# Patient Record
Sex: Male | Born: 2000 | Race: White | Hispanic: No | Marital: Single | Smoking: Never smoker
Health system: Southern US, Community
[De-identification: ages and names within clinical notes are randomized; demographics above are authoritative.]

## PROBLEM LIST (undated history)

## (undated) DIAGNOSIS — F419 Anxiety disorder, unspecified: Secondary | ICD-10-CM

## (undated) DIAGNOSIS — F411 Generalized anxiety disorder: Secondary | ICD-10-CM

## (undated) DIAGNOSIS — F329 Major depressive disorder, single episode, unspecified: Secondary | ICD-10-CM

## (undated) DIAGNOSIS — F913 Oppositional defiant disorder: Secondary | ICD-10-CM

## (undated) DIAGNOSIS — Z973 Presence of spectacles and contact lenses: Secondary | ICD-10-CM

## (undated) DIAGNOSIS — F32A Depression, unspecified: Secondary | ICD-10-CM

## (undated) DIAGNOSIS — F4010 Social phobia, unspecified: Secondary | ICD-10-CM

## (undated) HISTORY — PX: OTHER SURGICAL HISTORY: SHX169

## (undated) HISTORY — DX: Depression, unspecified: F32.A

## (undated) HISTORY — DX: Oppositional defiant disorder: F91.3

## (undated) HISTORY — DX: Major depressive disorder, single episode, unspecified: F32.9

## (undated) HISTORY — DX: Presence of spectacles and contact lenses: Z97.3

---
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
## 2022-09-15 DIAGNOSIS — Z0279 Encounter for issue of other medical certificate: Secondary | ICD-10-CM

## 2022-10-31 ENCOUNTER — Ambulatory Visit (INDEPENDENT_AMBULATORY_CARE_PROVIDER_SITE_OTHER): Payer: BC Managed Care – PPO | Admitting: Behavioral Health

## 2022-10-31 DIAGNOSIS — Z0389 Encounter for observation for other suspected diseases and conditions ruled out: Secondary | ICD-10-CM

## 2022-10-31 NOTE — Progress Notes (Signed)
No charge today because Pt was seeking full psychological testing and was advised that we do not do that here at [facility]. I advised him to try [facility] Psychological Assessment and Treatment. Pt is trying to enter the [employer] and had prior hx of psychiatric care 7 years ago. Pt has not been treated or on medication since.

## 2024-01-11 ENCOUNTER — Ambulatory Visit (HOSPITAL_COMMUNITY)
Admission: EM | Admit: 2024-01-11 | Discharge: 2024-01-11 | Disposition: A | Discharge status: Home / Self Care | Payer: Self-pay

## 2024-01-11 NOTE — Progress Notes (Signed)
   01/11/24 1322  [facility] Triage Screening (Walk-ins at [facility] only)  How Did You Hear About Us ? Self  What Is the Reason for Your Visit/Call Today? Patient denies any psychiatric history, however presents reporting he needs evaluation to update his record.  He is somewhat frustrated and shows documentation of false claims in his medical record preventing him from enlisting in the [employer].  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  Patient was initially seen at [facility] today and they referred him here for further assistance.  He is hopeful a provider can do a brief eval and document he is not displaying symptoms and would not require further eval/treatment. He denies SI, HI, AVH or SA hx.  How Long Has This Been Causing You Problems? 1 wk - 1 month  Have You Recently Had Any Thoughts About Hurting Yourself? No  Are You Planning to Commit Suicide/Harm Yourself At This time? No  Have you Recently Had Thoughts About Hurting Someone Sherral? No  Are You Planning To Harm Someone At This Time? No  Physical Abuse Denies  Verbal Abuse Denies  Sexual Abuse Denies  Exploitation of patient/patient's resources Denies  Self-Neglect Denies  Possible abuse reported to:  (N/A)  Are you currently experiencing any auditory, visual or other hallucinations? No  Have You Used Any Alcohol or Drugs in the Past 24 Hours? No  Do you have any current medical co-morbidities that require immediate attention? No  Clinician description of patient physical appearance/behavior: Patient is somewhat frustrated initially, however calms when clinician offers support. He is AAOx5.  What Do You Feel Would Help You the Most Today? Social Support  If access to [facility] was not available, would you have sought care in the Emergency Department? No  Determination of Need Routine (7 days)  Options For Referral  Other: Comment

## 2024-01-11 NOTE — ED Provider Notes (Signed)
 Per Triage note: Patient denies any psychiatric history, however presents reporting he needs evaluation to update his record. He is somewhat frustrated and shows documentation of false claims in his medical record preventing him from enlisting in the [employer]. 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 Patient was initially seen at [facility] today and they referred him here for further assistance. He is hopeful a provider can do a brief eval and document he is not displaying symptoms and would not require further eval/treatment. He denies SI, HI, AVH or SA hx.   This provider spoke with the patient to see if he would like to proceed with a crisis assessment. He states that he just need documentation to show the [employer] that he does not have mental health problems. He denies psychiatric complaints, no SI, HI, psychosis, recent trauma, difficulty sleeping or eating, depression, mania or substance abuse. He does not appear to be experiencing a mental health crisis based on the information provided. He was recommended to dispute previous mental health diagnoses if he believes they are not correct and follow up with outpatient psychiatry for a comprehensive psychiatric evaluation if required by the [employer]. Patient waived his rights to medical screening exam.   [facility] Address: [address], [place], [place] [address] Phone: [phone]  [facility] Address: [address], [place], [place] [address] Phone: [phone]  [facility] [place] Address: [address] [facility], [address], [place], [place] [address] Phone: [phone]  [facility] Address: [address]  [place], [place], [place] [address] Phone: [phone]

## 2024-02-02 ENCOUNTER — Ambulatory Visit (INDEPENDENT_AMBULATORY_CARE_PROVIDER_SITE_OTHER): Payer: Self-pay | Admitting: Behavioral Health

## 2024-02-02 DIAGNOSIS — Z0389 Encounter for observation for other suspected diseases and conditions ruled out: Secondary | ICD-10-CM

## 2024-02-02 NOTE — Progress Notes (Signed)
 Pt needing full psychological evaluation. Pt is seeking to enter [employer] and reports psychotic episode from 2017.  Referred him to [facility] or [facility] for further assessment. No charge for today visit and requesting deposit be returned to patient.
